# Patient Record
Sex: Female | Born: 2005 | Race: Black or African American | Hispanic: No | Marital: Single | Smoking: Never smoker
Health system: Southern US, Community
[De-identification: ages and names within clinical notes are randomized; demographics above are authoritative.]

## PROBLEM LIST (undated history)

## (undated) DIAGNOSIS — D649 Anemia, unspecified: Secondary | ICD-10-CM

## (undated) DIAGNOSIS — N390 Urinary tract infection, site not specified: Secondary | ICD-10-CM

## (undated) DIAGNOSIS — F419 Anxiety disorder, unspecified: Secondary | ICD-10-CM

## (undated) DIAGNOSIS — A749 Chlamydial infection, unspecified: Secondary | ICD-10-CM

## (undated) DIAGNOSIS — J45909 Unspecified asthma, uncomplicated: Secondary | ICD-10-CM

## (undated) DIAGNOSIS — F32A Depression, unspecified: Secondary | ICD-10-CM

## (undated) DIAGNOSIS — T7840XA Allergy, unspecified, initial encounter: Secondary | ICD-10-CM

## (undated) HISTORY — DX: Allergy, unspecified, initial encounter: T78.40XA

## (undated) HISTORY — PX: NO PAST SURGERIES: SHX2092

---
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
## 2024-01-03 ENCOUNTER — Other Ambulatory Visit: Payer: Self-pay | Admitting: Physician Assistant

## 2024-01-03 DIAGNOSIS — N200 Calculus of kidney: Secondary | ICD-10-CM

## 2024-01-04 ENCOUNTER — Ambulatory Visit
Admission: RE | Admit: 2024-01-04 | Discharge: 2024-01-04 | Disposition: A | Discharge status: Home / Self Care | Source: Ambulatory Visit | Attending: Physician Assistant | Admitting: Physician Assistant

## 2024-01-04 DIAGNOSIS — N200 Calculus of kidney: Secondary | ICD-10-CM

## 2024-01-20 ENCOUNTER — Other Ambulatory Visit: Payer: Self-pay

## 2024-01-20 ENCOUNTER — Encounter (HOSPITAL_BASED_OUTPATIENT_CLINIC_OR_DEPARTMENT_OTHER): Payer: Self-pay

## 2024-01-20 ENCOUNTER — Emergency Department (HOSPITAL_BASED_OUTPATIENT_CLINIC_OR_DEPARTMENT_OTHER)

## 2024-01-20 ENCOUNTER — Emergency Department (HOSPITAL_BASED_OUTPATIENT_CLINIC_OR_DEPARTMENT_OTHER)
Admission: EM | Admit: 2024-01-20 | Discharge: 2024-01-20 | Disposition: A | Discharge status: Home / Self Care | Attending: Emergency Medicine | Admitting: Emergency Medicine

## 2024-01-20 DIAGNOSIS — R109 Unspecified abdominal pain: Secondary | ICD-10-CM

## 2024-01-20 DIAGNOSIS — N3000 Acute cystitis without hematuria: Secondary | ICD-10-CM | POA: Diagnosis not present

## 2024-01-20 LAB — BASIC METABOLIC PANEL WITH GFR
Anion gap: 10 (ref 5–15)
BUN: 14 mg/dL (ref 6–20)
CO2: 23 mmol/L (ref 22–32)
Calcium: 9.6 mg/dL (ref 8.9–10.3)
Chloride: 103 mmol/L (ref 98–111)
Creatinine, Ser: 1.38 mg/dL — ABNORMAL HIGH (ref 0.44–1.00)
GFR, Estimated: 57 mL/min — ABNORMAL LOW (ref >60)
Glucose, Bld: 93 mg/dL (ref 70–99)
Potassium: 3.7 mmol/L (ref 3.5–5.1)
Sodium: 136 mmol/L (ref 135–145)

## 2024-01-20 LAB — URINALYSIS, ROUTINE W REFLEX MICROSCOPIC
Bacteria, UA: NONE SEEN
Bilirubin Urine: NEGATIVE
Glucose, UA: NEGATIVE mg/dL
Hgb urine dipstick: NEGATIVE
Ketones, ur: >80 mg/dL — AB
Nitrite: NEGATIVE
Protein, ur: 30 mg/dL — AB
Specific Gravity, Urine: 1.039 — ABNORMAL HIGH (ref 1.005–1.030)
pH: 5.5 (ref 5.0–8.0)

## 2024-01-20 LAB — PREGNANCY, URINE: Preg Test, Ur: NEGATIVE

## 2024-01-20 LAB — CBC WITH DIFFERENTIAL/PLATELET
Abs Immature Granulocytes: 0.03 10*3/uL (ref 0.00–0.07)
Basophils Absolute: 0.1 10*3/uL (ref 0.0–0.1)
Basophils Relative: 0 %
Eosinophils Absolute: 0.1 10*3/uL (ref 0.0–0.5)
Eosinophils Relative: 1 %
HCT: 39.6 % (ref 36.0–46.0)
Hemoglobin: 12.7 g/dL (ref 12.0–15.0)
Immature Granulocytes: 0 %
Lymphocytes Relative: 28 %
Lymphs Abs: 3.6 10*3/uL (ref 0.7–4.0)
MCH: 24.4 pg — ABNORMAL LOW (ref 26.0–34.0)
MCHC: 32.1 g/dL (ref 30.0–36.0)
MCV: 76.2 fL — ABNORMAL LOW (ref 80.0–100.0)
Monocytes Absolute: 1.3 10*3/uL — ABNORMAL HIGH (ref 0.1–1.0)
Monocytes Relative: 10 %
Neutro Abs: 7.8 10*3/uL — ABNORMAL HIGH (ref 1.7–7.7)
Neutrophils Relative %: 61 %
Platelets: 295 10*3/uL (ref 150–400)
RBC: 5.2 MIL/uL — ABNORMAL HIGH (ref 3.87–5.11)
RDW: 17.4 % — ABNORMAL HIGH (ref 11.5–15.5)
WBC: 12.9 10*3/uL — ABNORMAL HIGH (ref 4.0–10.5)
nRBC: 0 % (ref 0.0–0.2)

## 2024-01-20 MED ORDER — CEPHALEXIN 250 MG PO CAPS
500.0000 mg | ORAL_CAPSULE | Freq: Once | ORAL | Status: AC
  Administered 2024-01-20: 500 mg via ORAL
  Filled 2024-01-20: qty 2

## 2024-01-20 MED ORDER — ACETAMINOPHEN 500 MG PO TABS
1000.0000 mg | ORAL_TABLET | Freq: Once | ORAL | Status: AC
  Administered 2024-01-20: 1000 mg via ORAL
  Filled 2024-01-20: qty 2

## 2024-01-20 MED ORDER — CEPHALEXIN 500 MG PO CAPS
500.0000 mg | ORAL_CAPSULE | Freq: Two times a day (BID) | ORAL | 0 refills | Status: AC
Start: 2024-01-20 — End: 2024-01-25

## 2024-01-20 MED ORDER — ONDANSETRON 4 MG PO TBDP: 4.0000 mg | ORAL_TABLET | Freq: Three times a day (TID) | ORAL | 0 refills | Status: DC | PRN

## 2024-01-20 NOTE — ED Provider Notes (Signed)
 [facility] AT [facility] Provider Note   CSN: [id number] Arrival date & time: 01/20/24  2036     History  Chief Complaint  Patient presents with   Flank Pain    Marie Paul is a 18 y.o. female.  Patient here with pain in the right flank nausea.  History of anxiety depression UTI.  Does admit to marijuana use.  Denies any chest pain shortness of breath weakness numbness tingling.  Pain is much improved from earlier this evening.  She has had maybe kidney stones in the past but does not Solik she is ever had a CT scan that showed a kidney stone.  She denies any vaginal bleeding discharge.  She denies any concern for pregnancy.  She has had ileus in the past as well.  She denies any fever or chills.  Pain now resolved.  No specific pain with urination.  The history is provided by the patient.       Home Medications Prior to Admission medications   Medication Sig Start Date End Date Taking? Authorizing Provider  cephALEXin (KEFLEX) 500 MG capsule Take 1 capsule (500 mg total) by mouth 2 (two) times daily for 5 days. 01/20/24 01/25/24 Yes Dailyn Kempner, DO  ondansetron (ZOFRAN-ODT) 4 MG disintegrating tablet Take 1 tablet (4 mg total) by mouth every 8 (eight) hours as needed. 01/20/24  Yes Wynonna Fitzhenry, DO  albuterol (VENTOLIN HFA) 108 (90 Base) MCG/ACT inhaler Inhale 2 puffs into the lungs every 4 (four) hours as needed for wheezing or shortness of breath. 01/09/22   Trenton Gammon, MD  oxyCODONE-acetaminophen (PERCOCET/ROXICET) 5-325 MG tablet Take 1 tablet by mouth every 6 (six) hours as needed for severe pain (pain score 7-10). 11/26/23   Gwyneth Sprout, MD      Allergies    Other    Review of Systems   Review of Systems  Physical Exam Updated Vital Signs  ED Triage Vitals  Encounter Vitals Group     BP 01/20/24 2040 (!) 142/110     Systolic BP Percentile --      Diastolic BP Percentile --      Pulse Rate 01/20/24 2040 98     Resp  01/20/24 2040 18     Temp 01/20/24 2040 98.2 F (36.8 C)     Temp src --      SpO2 01/20/24 2040 93 %     Weight 01/20/24 2042 110 lb (49.9 kg)     Height 01/20/24 2042 5\' 3"  (1.6 m)     Head Circumference --      Peak Flow --      Pain Score 01/20/24 2042 8     Pain Loc --      Pain Education --      Exclude from Growth Chart --      Physical Exam Vitals and nursing note reviewed.  Constitutional:      General: She is not in acute distress.    Appearance: She is well-developed. She is not ill-appearing.  HENT:     Head: Normocephalic and atraumatic.     Nose: Nose normal.     Mouth/Throat:     Mouth: Mucous membranes are moist.  Eyes:     Extraocular Movements: Extraocular movements intact.     Conjunctiva/sclera: Conjunctivae normal.     Pupils: Pupils are equal, round, and reactive to light.  Cardiovascular:     Rate and Rhythm: Regular rhythm.     Pulses: Normal  pulses.     Heart sounds: Normal heart sounds. No murmur heard. Pulmonary:     Effort: Pulmonary effort is normal. No respiratory distress.     Breath sounds: Normal breath sounds.  Abdominal:     General: Abdomen is flat.     Palpations: Abdomen is soft.     Tenderness: There is no abdominal tenderness.  Musculoskeletal:        General: No swelling.     Cervical back: Normal range of motion and neck supple.  Skin:    General: Skin is warm and dry.     Capillary Refill: Capillary refill takes less than 2 seconds.  Neurological:     General: No focal deficit present.     Mental Status: She is alert and oriented to person, place, and time.     Cranial Nerves: No cranial nerve deficit.     Sensory: No sensory deficit.     Motor: No weakness.     Coordination: Coordination normal.  Psychiatric:        Mood and Affect: Mood normal.     ED Results / Procedures / Treatments   Labs (all labs ordered are listed, but only abnormal results are displayed) Labs Reviewed  URINALYSIS, ROUTINE W REFLEX  MICROSCOPIC - Abnormal; Notable for the following components:      Result Value   APPearance HAZY (*)    Specific Gravity, Urine 1.039 (*)    Ketones, ur >80 (*)    Protein, ur 30 (*)    Leukocytes,Ua LARGE (*)    All other components within normal limits  BASIC METABOLIC PANEL WITH GFR - Abnormal; Notable for the following components:   Creatinine, Ser 1.38 (*)    GFR, Estimated 57 (*)    All other components within normal limits  CBC WITH DIFFERENTIAL/PLATELET - Abnormal; Notable for the following components:   WBC 12.9 (*)    RBC 5.20 (*)    MCV 76.2 (*)    MCH 24.4 (*)    RDW 17.4 (*)    Neutro Abs 7.8 (*)    Monocytes Absolute 1.3 (*)    All other components within normal limits  PREGNANCY, URINE    EKG None  Radiology CT Renal Stone Study Result Date: 01/20/2024 CLINICAL DATA:  Flank pain EXAM: CT ABDOMEN AND PELVIS WITHOUT CONTRAST TECHNIQUE: Multidetector CT imaging of the abdomen and pelvis was performed following the standard protocol without IV contrast. RADIATION DOSE REDUCTION: This exam was performed according to the departmental dose-optimization program which includes automated exposure control, adjustment of the mA and/or kV according to patient size and/or use of iterative reconstruction technique. COMPARISON:  None Available. FINDINGS: Lower chest: Lung bases clear.  No pericardial or pleural effusion. Hepatobiliary: No focal liver abnormality is seen. No gallstones, gallbladder wall thickening, or biliary dilatation. Pancreas: Unremarkable. No pancreatic ductal dilatation or surrounding inflammatory changes. Spleen: Normal in size without focal abnormality. Adrenals/Urinary Tract: No adrenal lesions identified. No nephrolithiasis or hydronephrosis on the left. There is right-sided hydronephrosis without evidence of a stone. This could be due to either pyelonephritis or recent passage of an obstructing stone. Unremarkable urinary bladder. Stomach/Bowel: Stomach is  within normal limits. Appendix appears normal. No evidence of bowel wall thickening, distention, or inflammatory changes. Vascular/Lymphatic: No significant vascular findings are present. No enlarged abdominal or pelvic lymph nodes. Reproductive: Uterus and bilateral adnexa are unremarkable. Other: No abdominal wall hernia or abnormality. No abdominopelvic ascites. Musculoskeletal: No acute or significant osseous findings. IMPRESSION: 1.  Right-sided hydronephrosis without evidence of a stone. This could be due to either pyelonephritis or recent passage of an obstructing stone. 2. No other significant abnormalities. Electronically Signed   By: Layla Maw M.D.   On: 01/20/2024 21:43    Procedures Procedures    Medications Ordered in ED Medications  cephALEXin (KEFLEX) capsule 500 mg (500 mg Oral Given 01/20/24 2252)  acetaminophen (TYLENOL) tablet 1,000 mg (1,000 mg Oral Given 01/20/24 2252)    ED Course/ Medical Decision Making/ A&P                                 Medical Decision Making Amount and/or Complexity of Data Reviewed Labs: ordered. Radiology: ordered.  Risk OTC drugs. Prescription drug management.   Marie Paul is here with right flank pain.  Normal vitals.  No fever.  History of UTI asthma.  History of marijuana use.  She had pretty severe right flank pain earlier this evening.  Now resolved upon my evaluation.  But she is already had a CBC CMP lipase urinalysis CT scan done prior to my evaluation.  There is concern that she might have a history of kidney stones as she has had some recurrent pain like this in the past.  But CT scans have never showed a stone.  She has had ileus in the past.  Differential diagnosis includes UTI kidney stone ileus/bowel obstruction, seems less likely to be appendicitis.  Exam currently is unremarkable.  CT scan per radiology report does show some right-sided hydronephrosis but without evidence of a stone.  They suggest may be  pyelonephritis or recent passage of a obstructive kidney stone.  Otherwise CT is unremarkable.  There is no ovarian cyst.  There is no appendicitis or changes to the bowel wall, no bowel obstruction or ileus.  Ultimately I do suspect that she has a recent passed kidney stone given the history and hydronephrosis.  She has got a mild elevation of her creatinine to 1.3 but otherwise lab works unremarkable.  Mild white count of 12.  I will put her on antibiotics to treat for possibly a pyelonephritis/kidney infection/UTI.  Will Keflex.  Will prescribe Zofran as needed.  Ultimately I do think that this is a passed kidney stone.  Pains now resolved.  Will have her follow-up with urology.  Will have her follow-up with GI as well.  I do think there could be an element of marijuana hyperemesis.  She has had an ileus in the past.  Does not seem like there is inflammatory bowel process but they could be reasonable for her to maybe discuss gotten a colonoscopy if she continues to have some chronic pain.  Overall discharged in good condition.  Understands return precautions.  This chart was dictated using voice recognition software.  Despite best efforts to proofread,  errors can occur which can change the documentation meaning.         Final Clinical Impression(s) / ED Diagnoses Final diagnoses:  Acute cystitis without hematuria  Abdominal pain, unspecified abdominal location    Rx / DC Orders ED Discharge Orders          Ordered    ondansetron (ZOFRAN-ODT) 4 MG disintegrating tablet  Every 8 hours PRN        01/20/24 2238    cephALEXin (KEFLEX) 500 MG capsule  2 times daily        01/20/24 2238  Virgina Norfolk, DO 01/20/24 2308

## 2024-01-20 NOTE — Discharge Instructions (Addendum)
 Recommend cessation of marijuana.  Take antibiotic as prescribed.  Take Zofran as needed for nausea.  Follow-up with urology and GI.  Return if symptoms worsen.  Recommend 1000 mg of Tylenol every 6 hours as needed for pain recommend 400 mg ibuprofen every 8 hours as needed for pain.

## 2024-01-20 NOTE — ED Triage Notes (Signed)
 Intermittent right flank pain for several weeks. Pain intensified today with vomiting. Seen at UC earlier today and encouraged to ED.   Given Toradol/ Zofran pta.   Says she's been seen recently for [place] and CT scans.

## 2024-01-25 ENCOUNTER — Other Ambulatory Visit

## 2024-07-04 ENCOUNTER — Encounter: Payer: Self-pay | Admitting: Obstetrics and Gynecology

## 2024-07-04 ENCOUNTER — Ambulatory Visit: Admitting: Obstetrics and Gynecology

## 2024-07-04 VITALS — BP 94/60 | HR 94 | Ht 64.0 in | Wt 113.0 lb

## 2024-07-04 DIAGNOSIS — N760 Acute vaginitis: Secondary | ICD-10-CM

## 2024-07-04 DIAGNOSIS — Z3009 Encounter for other general counseling and advice on contraception: Secondary | ICD-10-CM

## 2024-07-04 DIAGNOSIS — F419 Anxiety disorder, unspecified: Secondary | ICD-10-CM | POA: Diagnosis not present

## 2024-07-04 DIAGNOSIS — R399 Unspecified symptoms and signs involving the genitourinary system: Secondary | ICD-10-CM

## 2024-07-04 DIAGNOSIS — Z30013 Encounter for initial prescription of injectable contraceptive: Secondary | ICD-10-CM

## 2024-07-04 DIAGNOSIS — B9689 Other specified bacterial agents as the cause of diseases classified elsewhere: Secondary | ICD-10-CM

## 2024-07-04 DIAGNOSIS — Z3202 Encounter for pregnancy test, result negative: Secondary | ICD-10-CM | POA: Diagnosis not present

## 2024-07-04 DIAGNOSIS — Z1331 Encounter for screening for depression: Secondary | ICD-10-CM

## 2024-07-04 LAB — POCT URINALYSIS DIPSTICK
Bilirubin, UA: NEGATIVE
Blood, UA: NEGATIVE
Glucose, UA: NEGATIVE
Ketones, UA: NEGATIVE
Nitrite, UA: NEGATIVE
Protein, UA: NEGATIVE
Spec Grav, UA: 1.025 (ref 1.010–1.025)
Urobilinogen, UA: 0.2 U/dL
pH, UA: 6 (ref 5.0–8.0)

## 2024-07-04 LAB — POCT URINE PREGNANCY: Preg Test, Ur: NEGATIVE

## 2024-07-04 MED ORDER — MEDROXYPROGESTERONE ACETATE 150 MG/ML IM SUSP: 150.0000 mg | INTRAMUSCULAR | 3 refills | Status: AC

## 2024-07-04 MED ORDER — METRONIDAZOLE 0.75 % VA GEL: 1.0000 | Freq: Every day | VAGINAL | 1 refills | Status: DC

## 2024-07-04 MED ORDER — MEDROXYPROGESTERONE ACETATE 150 MG/ML IM SUSP
150.0000 mg | Freq: Once | INTRAMUSCULAR | Status: AC
  Administered 2024-07-04: 150 mg via INTRAMUSCULAR

## 2024-07-04 NOTE — Progress Notes (Signed)
 NEW GYNECOLOGY VISIT Chief Complaint  Patient presents with   NEW PATIENT/GYN     Subjective:  Marie Paul is a 18 y.o. G1P0010 who presents for contraceptive consult.  She reports she is interested in starting depo. Also reports foul smelling vaginal discharge for a couple months. Has had BV before. Per chart review, she had a vaginal swab 8/21 that was intermediate for BV. STI testing negative at that time. She also reports urinary frequency and urgency. Hx kidney stones/infections.   Gyn History: Patient's last menstrual period was 06/04/2024 (exact date). Sexually active: yes/no: Yes Contraception: condoms History of STIs: Yes: hx chlamydia, treated Last pap: N/A History of abnormal pap: N/A Periods: regular     07/04/2024    1:15 PM 07/20/2022    5:33 PM  Depression screen PHQ 2/9  Decreased Interest 0 3  Down, Depressed, Hopeless 1 3  PHQ - 2 Score 1 6  Altered sleeping 1 3  Tired, decreased energy 1 3  Change in appetite 0 3  Feeling bad or failure about yourself  0 2  Trouble concentrating 0 3  Moving slowly or fidgety/restless 0 3  Suicidal thoughts 0   PHQ-9 Score 3 23  Difficult doing work/chores Not difficult at all        07/04/2024    1:15 PM 07/20/2022    5:33 PM  GAD 7 : Generalized Anxiety Score  Nervous, Anxious, on Edge 2 3  Control/stop worrying 2 3  Worry too much - different things 2 3  Trouble relaxing 2 3  Restless 2 2  Easily annoyed or irritable 2 3  Afraid - awful might happen 2 3  Total GAD 7 Score 14 20  Anxiety Difficulty Somewhat difficult       OB History     Gravida  1   Para      Term      Preterm      AB  1   Living  0      SAB      IAB      Ectopic  1   Multiple      Live Births              Past Medical History:  Diagnosis Date   Adjustment disorder with depressed mood 11/22/2015   Adopted 11/13/2013   Adopted    Allergy    [employer] 03/12/2020   Anemia    Anxiety    Asthma     Chlamydia    Depression    Growing pains 12/02/2015   History of motion sickness 12/02/2015   UTI (urinary tract infection)     Past Surgical History:  Procedure Laterality Date   NO PAST SURGERIES      Social History   Socioeconomic History   Marital status: Single    Spouse name: Not on file   Number of children: Not on file   Years of education: Not on file   Highest education level: Not on file  Occupational History   Not on file  Tobacco Use   Smoking status: Never    Passive exposure: Current   Smokeless tobacco: Current   Tobacco comments:        Stopped with +preg  Vaping Use   Vaping status: Every Day  Substance and Sexual Activity   Alcohol use: Not Currently   Drug use: Yes    Frequency: 7.0 times per week    Types: Marijuana  Comment: approximately one blunt per day, none since + preg   Sexual activity: Yes    Birth control/protection: Condom  Other Topics Concern   Not on file  Social History Narrative   Hadassah is being adopted by her foster parents 01/2015   11th grade at [employer] 22-23 school year.   Braelynne splits time living with adoptive mother and adoptive father. At father's house it is Leeasia and father in a two bedroom apartment. Pets in home include 1 cat. No smoke exposures at father's house. At mother's house it is Marielis, mother, and several other people in a house. Pets in home include 2 dogs. No smoke exposures at [employer].    Social Drivers of Corporate investment banker Strain: Not on File (08/25/2023)   Received from [employer]    Financial Resource Strain: 0  Food Insecurity: Not at Risk (10/07/2023)   Received from [facility] Insecurity    Within the past 12 months, you worried that your food would run out before you got money to buy more.: 1  Transportation Needs: Not at Risk (10/07/2023)   Received from [employer] Needs    In the past 12 months, has lack of  transportation kept you from medical appointments, meetings, work or from getting things needed for daily living?: 1  Physical Activity: Not on File (08/25/2023)   Received from [facility]   Physical Activity    Physical Activity: 0  Stress: Not on File (08/25/2023)   Received from [facility]   Stress    Stress: 0  Social Connections: Not on File (08/25/2023)   Received from [facility]   Social Connections    Connectedness: 0    Family History  Problem Relation Age of Onset   Crohn's disease Mother    Healthy Father    Asthma Sister    Eczema Sister     Current Outpatient Medications on File Prior to Visit  Medication Sig Dispense Refill   albuterol  (VENTOLIN  HFA) 108 (90 Base) MCG/ACT inhaler Inhale 2 puffs into the lungs every 4 (four) hours as needed for wheezing or shortness of breath. 18 g 0   ondansetron  (ZOFRAN -ODT) 4 MG disintegrating tablet Take 1 tablet (4 mg total) by mouth every 8 (eight) hours as needed. 20 tablet 0   oxyCODONE -acetaminophen  (PERCOCET/ROXICET) 5-325 MG tablet Take 1 tablet by mouth every 6 (six) hours as needed for severe pain (pain score 7-10). (Patient not taking: Reported on 07/04/2024) 15 tablet 0   No current facility-administered medications on file prior to visit.    Allergies  Allergen Reactions   Other     Cats - itching, watery eyes, nose     Objective:   Vitals:   07/04/24 1307  BP: 94/60  Pulse: 94  Weight: 113 lb (51.3 kg)  Height: 5' 4 (1.626 m)   Physical Examination:   General appearance - well appearing, and in no distress  Mental status - alert, oriented to person, place, and time  Psych:  normal mood and affect  Skin - warm and dry, normal color, no suspicious lesions noted    Assessment and Plan:  1. Contraceptive education (Primary) Counseled on options for contraception in detail. Patient interested in depo. Reviewed common side effects including weight gain, mood changes, irregular bleeding. Also reviewed recently depo has  been in the news for concern of an elevated risk of brain tumor (meningioma). Revjiewed that the overall  risk is very low (increased risk from 1 in 10,000 to 5 in 10,000 according to one study with several limitations). She is comfortable proceeding with depo today. She denies any unprotected intercourse since her last period. - UPT neg - start depo  2. Encounter for initial prescription of injectable contraceptive - POCT urine pregnancy  3. Bacterial vaginosis Exam deferred given symptoms and swab at PCP. Rx given - metroNIDAZOLE  (METROGEL ) 0.75 % vaginal gel; Place 1 Applicatorful vaginally at bedtime. Apply one applicatorful to vagina at bedtime for 5 days  Dispense: 70 g; Refill: 1  4. Anxiety Patient reports stable mood, has a therapist. Declines further intervention  5. Urinary symptom or sign Will f/u ucx - POCT Urinalysis Dipstick - Urine Culture   Rollo ONEIDA Bring, MD, FACOG Obstetrician & Gynecologist, [facility] for [facility], [facility]

## 2024-07-04 NOTE — Progress Notes (Addendum)
 18 y.o. New GYN presents for [employer]. Pt wants to start DEPO. She currently uses Condoms for [facility], last sexual intercourse 07/02/24. C/o thick, copious, malodorous vaginal discharge x 2 months, kidney pain, urinary frequency.   UPT Negative  DEPO Injection given in RUOVG, tolerated well. RX sent to [facility] with 3 refills.  Next DEPO due Dec. 2 - 16, 2025  Administrations This Visit     medroxyPROGESTERone  (DEPO-PROVERA ) injection 150 mg     Admin Date 07/04/2024 Action Given Dose 150 mg Route Intramuscular Documented By Burnard Niels PARAS, RMA

## 2024-07-07 ENCOUNTER — Ambulatory Visit: Payer: Self-pay | Admitting: Obstetrics and Gynecology

## 2024-07-07 DIAGNOSIS — N3000 Acute cystitis without hematuria: Secondary | ICD-10-CM

## 2024-07-07 LAB — URINE CULTURE

## 2024-07-07 MED ORDER — SULFAMETHOXAZOLE-TRIMETHOPRIM 800-160 MG PO TABS: 1.0000 | ORAL_TABLET | Freq: Two times a day (BID) | ORAL | 0 refills | Status: AC

## 2024-07-08 DIAGNOSIS — N12 Tubulo-interstitial nephritis, not specified as acute or chronic: Secondary | ICD-10-CM | POA: Diagnosis not present

## 2024-07-08 DIAGNOSIS — R109 Unspecified abdominal pain: Secondary | ICD-10-CM | POA: Diagnosis present

## 2024-07-09 ENCOUNTER — Other Ambulatory Visit: Payer: Self-pay

## 2024-07-09 ENCOUNTER — Emergency Department (HOSPITAL_BASED_OUTPATIENT_CLINIC_OR_DEPARTMENT_OTHER)
Admission: EM | Admit: 2024-07-09 | Discharge: 2024-07-09 | Disposition: A | Discharge status: Home / Self Care | Attending: Emergency Medicine | Admitting: Emergency Medicine

## 2024-07-09 ENCOUNTER — Emergency Department (HOSPITAL_BASED_OUTPATIENT_CLINIC_OR_DEPARTMENT_OTHER)

## 2024-07-09 ENCOUNTER — Encounter (HOSPITAL_BASED_OUTPATIENT_CLINIC_OR_DEPARTMENT_OTHER): Payer: Self-pay

## 2024-07-09 DIAGNOSIS — N12 Tubulo-interstitial nephritis, not specified as acute or chronic: Secondary | ICD-10-CM

## 2024-07-09 LAB — URINALYSIS, W/ REFLEX TO CULTURE (INFECTION SUSPECTED)
Bilirubin Urine: NEGATIVE
Glucose, UA: NEGATIVE mg/dL
Nitrite: NEGATIVE
Protein, ur: 30 mg/dL — AB
Specific Gravity, Urine: 1.027 (ref 1.005–1.030)
WBC, UA: >50 WBC/hpf (ref 0–5)
pH: 8 (ref 5.0–8.0)

## 2024-07-09 LAB — BASIC METABOLIC PANEL WITH GFR
Anion gap: 13 (ref 5–15)
BUN: 14 mg/dL (ref 6–20)
CO2: 21 mmol/L — ABNORMAL LOW (ref 22–32)
Calcium: 9.3 mg/dL (ref 8.9–10.3)
Chloride: 105 mmol/L (ref 98–111)
Creatinine, Ser: 0.99 mg/dL (ref 0.44–1.00)
GFR, Estimated: >60 mL/min (ref >60)
Glucose, Bld: 106 mg/dL — ABNORMAL HIGH (ref 70–99)
Potassium: 3.6 mmol/L (ref 3.5–5.1)
Sodium: 139 mmol/L (ref 135–145)

## 2024-07-09 LAB — CBC
HCT: 35.9 % — ABNORMAL LOW (ref 36.0–46.0)
Hemoglobin: 11.9 g/dL — ABNORMAL LOW (ref 12.0–15.0)
MCH: 26.7 pg (ref 26.0–34.0)
MCHC: 33.1 g/dL (ref 30.0–36.0)
MCV: 80.7 fL (ref 80.0–100.0)
Platelets: 222 K/uL (ref 150–400)
RBC: 4.45 MIL/uL (ref 3.87–5.11)
RDW: 15 % (ref 11.5–15.5)
WBC: 17.2 K/uL — ABNORMAL HIGH (ref 4.0–10.5)
nRBC: 0 % (ref 0.0–0.2)

## 2024-07-09 LAB — PREGNANCY, URINE: Preg Test, Ur: NEGATIVE

## 2024-07-09 MED ORDER — SODIUM CHLORIDE 0.9 % IV SOLN
1.0000 g | Freq: Once | INTRAVENOUS | Status: AC
  Administered 2024-07-09: 1 g via INTRAVENOUS
  Filled 2024-07-09: qty 10

## 2024-07-09 MED ORDER — KETOROLAC TROMETHAMINE 10 MG PO TABS: 10.0000 mg | ORAL_TABLET | Freq: Four times a day (QID) | ORAL | 0 refills | Status: DC | PRN

## 2024-07-09 MED ORDER — CEFPODOXIME PROXETIL 200 MG PO TABS: 200.0000 mg | ORAL_TABLET | Freq: Two times a day (BID) | ORAL | 0 refills | Status: AC

## 2024-07-09 MED ORDER — FENTANYL CITRATE PF 50 MCG/ML IJ SOSY
50.0000 ug | PREFILLED_SYRINGE | Freq: Once | INTRAMUSCULAR | Status: AC
  Administered 2024-07-09: 50 ug via INTRAVENOUS
  Filled 2024-07-09: qty 1

## 2024-07-09 MED ORDER — FENTANYL CITRATE PF 50 MCG/ML IJ SOSY
50.0000 ug | PREFILLED_SYRINGE | INTRAMUSCULAR | Status: DC | PRN
  Administered 2024-07-09: 50 ug via INTRAVENOUS
  Filled 2024-07-09: qty 1

## 2024-07-09 MED ORDER — ONDANSETRON HCL 4 MG/2ML IJ SOLN
4.0000 mg | Freq: Once | INTRAMUSCULAR | Status: AC
  Administered 2024-07-09: 4 mg via INTRAVENOUS
  Filled 2024-07-09: qty 2

## 2024-07-09 MED ORDER — SODIUM CHLORIDE 0.9 % IV BOLUS
1000.0000 mL | Freq: Once | INTRAVENOUS | Status: AC
  Administered 2024-07-09: 1000 mL via INTRAVENOUS

## 2024-07-09 MED ORDER — SODIUM CHLORIDE 0.9 % IV SOLN: INTRAVENOUS | Status: DC

## 2024-07-09 NOTE — ED Triage Notes (Signed)
 Sudden right sided flank pain that manifested about 1 hour prior to arrival.   Recent + urine culture with prescription called in but she was out of town and unable to obtain.

## 2024-07-09 NOTE — ED Provider Notes (Signed)
 [place] [facility] DEPARTMENT AT [facility] Provider Note   CSN: [id number] Arrival date & time: 07/08/24  2358     Patient presents with: Flank Pain   Marie Paul is a 18 y.o. female.    Flank Pain     18 year old female presenting to the emerged by with sudden onset right-sided flank pain that started roughly 1 hour prior to arrival.  She had 1 episode of NBNB emesis as well.  She denies any abdominal pain.  She did have a recent urine culture and had been called in a prescription based on a urine culture that was positive for Staphylococcus saprophyticus but has not taken any antibiotics.  She was last seen in the emergency department in April 2025 during which time CT imaging showed right-sided hydronephrosis without evidence of a stone.  She has had intermittent flank pain over the past year.  Prior to Admission medications   Medication Sig Start Date End Date Taking? Authorizing Provider  cefpodoxime  (VANTIN ) 200 MG tablet Take 1 tablet (200 mg total) by mouth 2 (two) times daily for 10 days. 07/09/24 07/19/24 Yes Jerrol Agent, MD  ketorolac  (TORADOL ) 10 MG tablet Take 1 tablet (10 mg total) by mouth every 6 (six) hours as needed. 07/09/24  Yes Jerrol Agent, MD  albuterol  (VENTOLIN  HFA) 108 (90 Base) MCG/ACT inhaler Inhale 2 puffs into the lungs every 4 (four) hours as needed for wheezing or shortness of breath. 01/09/22   Curtiss Antonio CROME, MD  medroxyPROGESTERone  (DEPO-PROVERA ) 150 MG/ML injection Inject 1 mL (150 mg total) into the muscle every 3 (three) months. 07/04/24   Abigail Rollo DASEN, MD  metroNIDAZOLE  (METROGEL ) 0.75 % vaginal gel Place 1 Applicatorful vaginally at bedtime. Apply one applicatorful to vagina at bedtime for 5 days 07/04/24   Abigail Rollo DASEN, MD  ondansetron  (ZOFRAN -ODT) 4 MG disintegrating tablet Take 1 tablet (4 mg total) by mouth every 8 (eight) hours as needed. 01/20/24   Curatolo, Adam, DO  oxyCODONE -acetaminophen  (PERCOCET/ROXICET)  5-325 MG tablet Take 1 tablet by mouth every 6 (six) hours as needed for severe pain (pain score 7-10). Patient not taking: Reported on 07/04/2024 11/26/23   Doretha Folks, MD  sulfamethoxazole -trimethoprim  (BACTRIM  DS) 800-160 MG tablet Take 1 tablet by mouth 2 (two) times daily for 7 days. 07/07/24 07/14/24  Abigail Rollo DASEN, MD    Allergies: Other    Review of Systems  Gastrointestinal:  Positive for nausea and vomiting.  Genitourinary:  Positive for flank pain.  All other systems reviewed and are negative.   Updated Vital Signs BP 112/67   Pulse 81   Temp 97.6 F (36.4 C)   Resp 18   Ht 5' 4 (1.626 m)   Wt 51.3 kg   LMP 06/04/2024 (Exact Date)   SpO2 100%   BMI 19.40 kg/m   Physical Exam Vitals and nursing note reviewed.  Constitutional:      General: She is in acute distress.     Appearance: She is well-developed.     Comments: Tearful  HENT:     Head: Normocephalic and atraumatic.  Eyes:     Conjunctiva/sclera: Conjunctivae normal.  Cardiovascular:     Rate and Rhythm: Normal rate and regular rhythm.     Heart sounds: No murmur heard. Pulmonary:     Effort: Pulmonary effort is normal. No respiratory distress.     Breath sounds: Normal breath sounds.  Abdominal:     Palpations: Abdomen is soft.     Tenderness: There  is no abdominal tenderness. There is right CVA tenderness.  Musculoskeletal:        General: No swelling.     Cervical back: Neck supple.  Skin:    General: Skin is warm and dry.     Capillary Refill: Capillary refill takes less than 2 seconds.  Neurological:     Mental Status: She is alert.  Psychiatric:        Mood and Affect: Mood normal.     (all labs ordered are listed, but only abnormal results are displayed) Labs Reviewed  URINALYSIS, W/ REFLEX TO CULTURE (INFECTION SUSPECTED) - Abnormal; Notable for the following components:      Result Value   APPearance HAZY (*)    Hgb urine dipstick SMALL (*)    Ketones, ur TRACE (*)     Protein, ur 30 (*)    Leukocytes,Ua LARGE (*)    Bacteria, UA RARE (*)    All other components within normal limits  BASIC METABOLIC PANEL WITH GFR - Abnormal; Notable for the following components:   CO2 21 (*)    Glucose, Bld 106 (*)    All other components within normal limits  CBC - Abnormal; Notable for the following components:   WBC 17.2 (*)    Hemoglobin 11.9 (*)    HCT 35.9 (*)    All other components within normal limits  URINE CULTURE  PREGNANCY, URINE    EKG: None  Radiology: CT RENAL STONE STUDY Result Date: 07/09/2024 EXAM: CT ABDOMEN AND PELVIS WITHOUT CONTRAST 07/09/2024 01:15:58 AM TECHNIQUE: CT of the abdomen and pelvis was performed without the administration of intravenous contrast. Multiplanar reformatted images are provided for review. Automated exposure control, iterative reconstruction, and/or weight-based adjustment of the mA/kV was utilized to reduce the radiation dose to as low as reasonably achievable. COMPARISON: 01/20/2024 CLINICAL HISTORY: Abdominal/flank pain, stone suspected. Sudden right sided flank pain that manifested about 1 hour prior to arrival. Recent + urine culture with prescription called in but she was out of town and unable to obtain. FINDINGS: LOWER CHEST: No acute abnormality. LIVER: The liver is unremarkable. GALLBLADDER AND BILE DUCTS: Gallbladder is unremarkable. No biliary ductal dilatation. SPLEEN: No acute abnormality. PANCREAS: No acute abnormality. ADRENAL GLANDS: No acute abnormality. KIDNEYS, URETERS AND BLADDER: Chronic moderate right hydronephrosis with extrarenal pelvis, suggesting UPJ stenosis (coronal image 42). No renal, ureteral, or bladder calculi. No hydronephrosis. No perinephric or periureteral stranding. Urinary bladder is unremarkable. GI AND BOWEL: Stomach demonstrates no acute abnormality. There is no bowel obstruction. Normal appendix (image 55). PERITONEUM AND RETROPERITONEUM: No ascites. No free air. VASCULATURE: Aorta is  normal in caliber. LYMPH NODES: No lymphadenopathy. REPRODUCTIVE ORGANS: 4.4 cm right ovarian cyst/follicle (image 54), likely benign/physiologic. Uterus and left ovary are within normal limits. BONES AND SOFT TISSUES: No acute osseous abnormality. No focal soft tissue abnormality. IMPRESSION: 1. No acute findings. 2. 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 3. 4.4 cm right ovarian cyst/follicle, likely benign/physiologic. Electronically signed by: Pinkie Pebbles MD 07/09/2024 01:21 AM EDT RP Workstation: [id number]     Procedures   Medications Ordered in the ED  sodium chloride  0.9 % bolus 1,000 mL (1,000 mLs Intravenous New Bag/Given 07/09/24 0040)    And  0.9 %  sodium chloride  infusion ( Intravenous New Bag/Given 07/09/24 0044)  cefTRIAXone  (ROCEPHIN ) 1 g in sodium chloride  0.9 % 100 mL IVPB (1 g Intravenous New Bag/Given 07/09/24 0143)  fentaNYL  (SUBLIMAZE ) injection 50 mcg (50 mcg Intravenous Given 07/09/24 0144)  ondansetron  (ZOFRAN ) injection 4  mg (4 mg Intravenous Given 07/09/24 0041)  fentaNYL  (SUBLIMAZE ) injection 50 mcg (50 mcg Intravenous Given 07/09/24 0041)    Clinical Course as of 07/09/24 0156  Sun Jul 09, 2024  0122 Leukocytes,Ua(!): LARGE [JL]  0122 RBC / HPF: 21-50 [JL]  0122 WBC, UA: >50 [JL]  0122 Bacteria, UA(!): RARE [JL]  0123 WBC(!): 17.2 [JL]    Clinical Course User Index [JL] Jerrol Agent, MD                                 Medical Decision Making Amount and/or Complexity of Data Reviewed Labs: ordered. Decision-making details documented in ED Course. Radiology: ordered.  Risk Prescription drug management.    18 year old female presenting to the emerged by with sudden onset right-sided flank pain that started roughly 1 hour prior to arrival.  She had 1 episode of NBNB emesis as well.  She denies any abdominal pain.  She did have a recent urine culture and had been called in a prescription based on a urine culture that was positive for Staphylococcus  saprophyticus but has not taken any antibiotics.  She was last seen in the emergency department in April 2025 during which time CT imaging showed right-sided hydronephrosis without evidence of a stone.  She has had intermittent flank pain over the past year.  On arrival, the patient was afebrile, not tachycardic or tachypneic, hemodynamically stable, saturating well on room air.  Patient with right-sided CVA tenderness in the setting of sudden onset right-sided flank pain.  Considered nephrolithiasis, considered UTI/pyelonephritis.  Less likely other acute intra-abdominal abnormality.    IV access obtained the patient was administered IV fentanyl , IV Zofran , IV fluid bolus for volume resuscitation, nausea control and pain control.  Labs: Urinalysis positive for UTI with large leukocytes, 20-50 RBCs and greater than 50 WBCs, rare bacteria present, CBC with leukocytosis 17.2, hemoglobin 11.9, BMP generally unremarkable with creatinine 0.99.  Bicarbonate 21, anion gap 13. Urine preg neg.  CT stone study:  IMPRESSION:  1. No acute findings.  2. 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  3. 4.4 cm right ovarian cyst/follicle, likely benign/physiologic.    Urology: Dr. Cam of on-call urology who recommended treatment for pyelonephritis and outpatient follow-up in clinic.     Final diagnoses:  Pyelonephritis    ED Discharge Orders          Ordered    Ambulatory referral to Urology        07/09/24 0154    cefpodoxime  (VANTIN ) 200 MG tablet  2 times daily        07/09/24 0155    ketorolac  (TORADOL ) 10 MG tablet  Every 6 hours PRN        07/09/24 0155               Jerrol Agent, MD 07/09/24 [id number]

## 2024-07-09 NOTE — Discharge Instructions (Addendum)
 You were found to have pyelonephritis with no evidence of stone seen on CT imaging.  Please follow-up with urology outpatient regarding your ER presentation today.  Take antibiotics for UTI, Toradol  has been prescribed for pain control.

## 2024-07-11 LAB — URINE CULTURE: Culture: 80000 — AB

## 2024-07-12 ENCOUNTER — Telehealth (HOSPITAL_BASED_OUTPATIENT_CLINIC_OR_DEPARTMENT_OTHER): Payer: Self-pay

## 2024-07-12 NOTE — Telephone Encounter (Signed)
 Post ED Visit - Positive Culture Follow-up  Culture report reviewed by antimicrobial stewardship pharmacist: [facility] []  Rankin Dee, Pharm.D. []  Venetia Gully, Pharm.D., BCPS AQ-ID []  Garrel Crews, Pharm.D., BCPS []  Almarie Lunger, Pharm.D., BCPS []  [place], [address].D., BCPS, AAHIVP []  Rosaline Bihari, Pharm.D., BCPS, AAHIVP []  Vernell Meier, PharmD, BCPS []  Latanya Hint, PharmD, BCPS []  Donald Medley, PharmD, BCPS []  Rocky Bold, PharmD []  Dorothyann Alert, PharmD, BCPS []  Morene Babe, PharmD  [facility] []  Rosaline Edison, PharmD []  Romona Bliss, PharmD []  Dolphus Roller, PharmD []  Veva Seip, Rph []  Vernell Daunt) Leonce, PharmD []  Eva Allis, PharmD []  Rosaline Millet, PharmD []  Iantha Batch, PharmD []  Arvin Gauss, PharmD []  Wanda Hasting, PharmD []  Ronal Rav, PharmD []  Rocky Slade, PharmD []  Bard Jeans, PharmD   Positive urine culture Reviewed by ED provider Lonni Sakai, MD Pt c/o of right side flank pain, urology contacted to treat pyleo - MD aware of culture.  no further patient follow-up is required at this time.  Marie Paul 07/12/2024, 11:35 AM

## 2024-07-19 ENCOUNTER — Encounter (INDEPENDENT_AMBULATORY_CARE_PROVIDER_SITE_OTHER): Payer: Self-pay

## 2024-07-20 ENCOUNTER — Encounter (INDEPENDENT_AMBULATORY_CARE_PROVIDER_SITE_OTHER): Payer: Self-pay

## 2024-09-19 ENCOUNTER — Ambulatory Visit: Payer: Self-pay

## 2024-09-19 MED ORDER — MEDROXYPROGESTERONE ACETATE 150 MG/ML IM SUSP: 150.0000 mg | INTRAMUSCULAR | Status: AC

## 2024-09-19 NOTE — Progress Notes (Unsigned)
 Pt came for depo, already picked up rx from pharmacy but forgot at home Depo window from Dec 2-16 Rescheduled appt for 12/8

## 2024-09-21 ENCOUNTER — Ambulatory Visit: Payer: Self-pay | Admitting: Nurse Practitioner

## 2024-09-25 ENCOUNTER — Ambulatory Visit: Payer: Self-pay

## 2024-10-16 ENCOUNTER — Emergency Department (HOSPITAL_BASED_OUTPATIENT_CLINIC_OR_DEPARTMENT_OTHER)
Admission: EM | Admit: 2024-10-16 | Discharge: 2024-10-16 | Disposition: A | Discharge status: Home / Self Care | Payer: Self-pay | Attending: Emergency Medicine | Admitting: Emergency Medicine

## 2024-10-16 ENCOUNTER — Other Ambulatory Visit: Payer: Self-pay

## 2024-10-16 ENCOUNTER — Encounter (HOSPITAL_BASED_OUTPATIENT_CLINIC_OR_DEPARTMENT_OTHER): Payer: Self-pay | Admitting: Emergency Medicine

## 2024-10-16 DIAGNOSIS — J4541 Moderate persistent asthma with (acute) exacerbation: Secondary | ICD-10-CM | POA: Insufficient documentation

## 2024-10-16 MED ORDER — ALBUTEROL SULFATE HFA 108 (90 BASE) MCG/ACT IN AERS
1.0000 | INHALATION_SPRAY | Freq: Once | RESPIRATORY_TRACT | Status: AC
  Administered 2024-10-16: 2 via RESPIRATORY_TRACT

## 2024-10-16 MED ORDER — IPRATROPIUM-ALBUTEROL 0.5-2.5 (3) MG/3ML IN SOLN
3.0000 mL | Freq: Once | RESPIRATORY_TRACT | Status: AC
  Administered 2024-10-16: 3 mL via RESPIRATORY_TRACT
  Filled 2024-10-16: qty 3

## 2024-10-16 MED ORDER — ALBUTEROL SULFATE HFA 108 (90 BASE) MCG/ACT IN AERS
INHALATION_SPRAY | RESPIRATORY_TRACT | Status: AC
  Filled 2024-10-16: qty 6.7

## 2024-10-16 MED ORDER — PREDNISONE 10 MG PO TABS: 20.0000 mg | ORAL_TABLET | Freq: Every day | ORAL | 0 refills | Status: AC

## 2024-10-16 MED ORDER — PREDNISONE 50 MG PO TABS
60.0000 mg | ORAL_TABLET | Freq: Once | ORAL | Status: AC
  Administered 2024-10-16: 60 mg via ORAL
  Filled 2024-10-16: qty 1

## 2024-10-16 NOTE — ED Provider Notes (Signed)
 " [place] EMERGENCY DEPARTMENT AT [facility] Provider Note   CSN: [id number] Arrival date & time: 10/16/24  0757     History Chief Complaint  Patient presents with   Shortness of Breath    HPI: Marie Paul is a 18 y.o. female with history pertinent for asthma who presents complaining of wheezing. Patient arrived via POV accompanied by boyfriend, Marie Paul.  History provided by patient.  No interpreter required during this encounter.  Patient reports that over the past several days she has had wheezing and cough that has been increased.  Reports that it feels like her typical asthma exacerbations which are generally characterized by wheezing and cough.  Reports that she has been using her albuterol  inhaler multiple times per day, reports that her only home inhaler is albuterol .  Reports that she last used it last night when she ran out of it, however this morning she felt like she had recurrent significant wheezing, therefore wanted to come into the emergency department.  Denies any fevers, chills, congestion, rhinorrhea, sore throat, chest pain, denies shortness of breath, though does endorse sensation of chest tightness with expiration consistent with her prior asthma exacerbations.  Reports that she typically has a cough when she has asthma, and that her cough is nonproductive.  Denies GI symptoms.  Patient's recorded medical, surgical, social, medication list and allergies were reviewed in the Snapshot window as part of the initial history.   Prior to Admission medications  Medication Sig Start Date End Date Taking? Authorizing Provider  predniSONE  (DELTASONE ) 10 MG tablet Take 2 tablets (20 mg total) by mouth daily for 4 days. 10/16/24 10/20/24 Yes Rogelia Jerilynn RAMAN, MD  albuterol  (VENTOLIN  HFA) 108 [phone] Base) MCG/ACT inhaler Inhale 2 puffs into the lungs every 4 (four) hours as needed for wheezing or shortness of breath. 01/09/22   Curtiss Antonio CROME, MD  ketorolac  (TORADOL )  10 MG tablet Take 1 tablet (10 mg total) by mouth every 6 (six) hours as needed. 07/09/24   Jerrol Agent, MD  medroxyPROGESTERone  (DEPO-PROVERA ) 150 MG/ML injection Inject 1 mL (150 mg total) into the muscle every 3 (three) months. 07/04/24   Abigail Rollo DASEN, MD  metroNIDAZOLE  (METROGEL ) 0.75 % vaginal gel Place 1 Applicatorful vaginally at bedtime. Apply one applicatorful to vagina at bedtime for 5 days 07/04/24   Abigail Rollo DASEN, MD  oxyCODONE -acetaminophen  (PERCOCET/ROXICET) 5-325 MG tablet Take 1 tablet by mouth every 6 (six) hours as needed for severe pain (pain score 7-10). Patient not taking: Reported on 07/04/2024 11/26/23   Doretha Folks, MD     Allergies: Other   Review of Systems   ROS as per HPI  Physical Exam Updated Vital Signs BP 125/81   Pulse 66   Temp 98.4 F (36.9 C)   Resp 13   Wt 50.8 kg   LMP 10/12/2024   SpO2 97%   BMI 19.22 kg/m  Physical Exam Vitals and nursing note reviewed.  Constitutional:      General: She is not in acute distress.    Appearance: She is well-developed.  HENT:     Head: Normocephalic and atraumatic.  Eyes:     Conjunctiva/sclera: Conjunctivae normal.  Cardiovascular:     Rate and Rhythm: Normal rate and regular rhythm.     Heart sounds: No murmur heard.    Comments: Patient with heart rate in the 80s on my evaluation less than 10 minutes into patient's ED stay. Pulmonary:     Effort: Pulmonary effort is normal.  No tachypnea, accessory muscle usage or respiratory distress.     Breath sounds: Wheezing (Diffuse wheezing in both anterior and posterior lung fields) present.  Abdominal:     Palpations: Abdomen is soft.     Tenderness: There is no abdominal tenderness.  Musculoskeletal:        General: No swelling.     Cervical back: Neck supple.  Skin:    General: Skin is warm and dry.     Capillary Refill: Capillary refill takes less than 2 seconds.  Neurological:     Mental Status: She is alert.  Psychiatric:        Mood  and Affect: Mood normal.     ED Course/ Medical Decision Making/ A&P    Procedures Procedures   Medications Ordered in ED Medications  albuterol  (VENTOLIN  HFA) 108 (90 Base) MCG/ACT inhaler (has no administration in time range)  ipratropium-albuterol  (DUONEB) 0.5-2.5 (3) MG/3ML nebulizer solution 3 mL (3 mLs Nebulization Given 10/16/24 0808)    And  ipratropium-albuterol  (DUONEB) 0.5-2.5 (3) MG/3ML nebulizer solution 3 mL (3 mLs Nebulization Given 10/16/24 0808)    And  ipratropium-albuterol  (DUONEB) 0.5-2.5 (3) MG/3ML nebulizer solution 3 mL (3 mLs Nebulization Given 10/16/24 0807)  predniSONE  (DELTASONE ) tablet 60 mg (60 mg Oral Given 10/16/24 0827)  albuterol  (VENTOLIN  HFA) 108 (90 Base) MCG/ACT inhaler 1-2 puff (2 puffs Inhalation Given 10/16/24 0926)    Medical Decision Making:   [name] is a 18 y.o. female who presents for wheezing, cough as per above.  Physical exam is pertinent for diffuse wheezing in all lung fields without tachypnea or accessory muscle usage.   The differential includes but is not limited to asthma exacerbation, wheezing, pneumonia, viral infection, respiratory distress.  Independent historian: None  External data reviewed: No pertinent external data  Labs: Not indicated  Radiology: Not indicated No results found.  EKG/Medicine tests: Ordered and Independent interpretation EKG Interpretation: Sinus tachycardia Atrial premature complex Low voltage, precordial leads Abnormal Q suggests anterior infarct Confirmed by Rogelia Satterfield ([id number]) on 10/16/2024 8:11:25 AM  Interventions: DuoNeb X3, prednisone   See the EMR for full details regarding lab and imaging results.  Patient was initially mildly tachycardic on arrival to the ED, however by the time of my evaluation less than 10 minutes into her ED stay, patient had spontaneous resolution of tachycardia despite actively being on a DuoNeb with albuterol  which can increase heart rate, thus  low index of suspicion for underlying disease causing tachycardia, more likely was related to patient's exertion of walking into the ED while simultaneously being significantly wheezy.  Will monitor heart rate while in the ED.  Given patient reports that all of her symptoms including her nonproductive cough and wheezing are consistent with her usual asthma exacerbation, states that this is not as bad as her typical asthma exacerbation (is primarily here because she ran out of her inhaler), and she does not otherwise have sick symptoms, has no focal lung sounds changes on exam, only diffuse wheezing without rhonchi, rales, decreased breath sounds, do not feel that patient initially requires labs or imaging as presentation is most consistent with asthma exacerbation.  Will reevaluate patient after breathing treatments and prednisone , to determine if patient requires additional breathing treatments and admission versus transition to albuterol  puffs and potential discharge home.  On reevaluation, patient has normal heart rate, resolution of wheezing, feels back to baseline, therefore will provide albuterol  puffs, prescribe short course of prednisone  and discharge home, patient is comfortable with this plan.  Presentation is most consistent with exacerbation of chronic illness  Discussion of management or test interpretations with external provider(s): Not indicated  Risk Drugs:Prescription drug management  Disposition: DISCHARGE: I believe that the patient is safe for discharge home with outpatient follow-up. Patient was informed of all pertinent physical exam, laboratory, and imaging findings. Patient's suspected etiology of their symptom presentation was discussed with the patient and all questions were answered. We discussed following up with PCP. I provided thorough ED return precautions. The patient feels safe and comfortable with this plan.  MDM generated using voice dictation software and may  contain dictation errors.  Please contact me for any clarification or with any questions.  Clinical Impression:  1. Moderate persistent asthma with exacerbation      Discharge   Final Clinical Impression(s) / ED Diagnoses Final diagnoses:  Moderate persistent asthma with exacerbation    Rx / DC Orders ED Discharge Orders          Ordered    predniSONE  (DELTASONE ) 10 MG tablet  Daily        10/16/24 0929             Rogelia Jerilynn RAMAN, MD 10/16/24 [phone]  "

## 2024-10-16 NOTE — Discharge Instructions (Addendum)
 Marie Paul  Thank you for allowing us  to take care of you today.  You came to the Emergency Department today because you had cough and wheezing reminiscent of your typical asthma attacks and ran out of your inhaler at home.  Here in the emergency department you are negative truly significantly wheezy, therefore we gave you breathing treatments and you are now back to usual self.  We also gave you a steroid.  We are sending you home with a new albuterol  inhaler as well as a prescription for 4 days of steroids to help calm the inflammation.  Please use 4 puffs of your inhaler every 6 hours for the next 2 days, and thereafter you can use it as needed for shortness of breath/cough/wheezing.  To-Do: 1. Please follow-up with your primary doctor within 1 - 2 weeks / as soon as possible.   Please return to the Emergency Department or call 911 if you experience have worsening of your symptoms, or do not get better, chest pain, shortness of breath, severe or significantly worsening pain, high fever, severe confusion, pass out or have any reason to think that you need emergency medical care.   We hope you feel better soon.   Mitzie Later, MD Department of Emergency Medicine [facility]

## 2024-10-16 NOTE — ED Triage Notes (Signed)
 Pt c/o shob starting last night. Denies fever. Reports out of inhaler. Cough noted

## 2024-10-22 ENCOUNTER — Emergency Department (HOSPITAL_BASED_OUTPATIENT_CLINIC_OR_DEPARTMENT_OTHER)
Admission: EM | Admit: 2024-10-22 | Discharge: 2024-10-22 | Disposition: A | Discharge status: Home / Self Care | Payer: Self-pay | Attending: Emergency Medicine | Admitting: Emergency Medicine

## 2024-10-22 DIAGNOSIS — N131 Hydronephrosis with ureteral stricture, not elsewhere classified: Secondary | ICD-10-CM | POA: Insufficient documentation

## 2024-10-22 DIAGNOSIS — N135 Crossing vessel and stricture of ureter without hydronephrosis: Secondary | ICD-10-CM

## 2024-10-22 DIAGNOSIS — N23 Unspecified renal colic: Secondary | ICD-10-CM

## 2024-10-22 LAB — BASIC METABOLIC PANEL WITH GFR
Anion gap: 12 (ref 5–15)
BUN: 12 mg/dL (ref 6–20)
CO2: 23 mmol/L (ref 22–32)
Calcium: 9.4 mg/dL (ref 8.9–10.3)
Chloride: 103 mmol/L (ref 98–111)
Creatinine, Ser: 0.87 mg/dL (ref 0.44–1.00)
GFR, Estimated: >60 mL/min
Glucose, Bld: 86 mg/dL (ref 70–99)
Potassium: 3.5 mmol/L (ref 3.5–5.1)
Sodium: 138 mmol/L (ref 135–145)

## 2024-10-22 LAB — URINALYSIS, ROUTINE W REFLEX MICROSCOPIC
Bilirubin Urine: NEGATIVE
Glucose, UA: NEGATIVE mg/dL
Hgb urine dipstick: NEGATIVE
Ketones, ur: 40 mg/dL — AB
Leukocytes,Ua: NEGATIVE
Nitrite: NEGATIVE
Protein, ur: NEGATIVE mg/dL
Specific Gravity, Urine: >1.03 — ABNORMAL HIGH (ref 1.005–1.030)
pH: 6 (ref 5.0–8.0)

## 2024-10-22 LAB — CBC WITH DIFFERENTIAL/PLATELET
Abs Immature Granulocytes: 0.02 K/uL (ref 0.00–0.07)
Basophils Absolute: 0 K/uL (ref 0.0–0.1)
Basophils Relative: 0 %
Eosinophils Absolute: 0.1 K/uL (ref 0.0–0.5)
Eosinophils Relative: 1 %
HCT: 38 % (ref 36.0–46.0)
Hemoglobin: 12.7 g/dL (ref 12.0–15.0)
Immature Granulocytes: 0 %
Lymphocytes Relative: 37 %
Lymphs Abs: 3.6 K/uL (ref 0.7–4.0)
MCH: 26.9 pg (ref 26.0–34.0)
MCHC: 33.4 g/dL (ref 30.0–36.0)
MCV: 80.5 fL (ref 80.0–100.0)
Monocytes Absolute: 0.8 K/uL (ref 0.1–1.0)
Monocytes Relative: 8 %
Neutro Abs: 5.2 K/uL (ref 1.7–7.7)
Neutrophils Relative %: 54 %
Platelets: 208 K/uL (ref 150–400)
RBC: 4.72 MIL/uL (ref 3.87–5.11)
RDW: 13.5 % (ref 11.5–15.5)
WBC: 9.7 K/uL (ref 4.0–10.5)
nRBC: 0 % (ref 0.0–0.2)

## 2024-10-22 LAB — PREGNANCY, URINE: Preg Test, Ur: NEGATIVE

## 2024-10-22 MED ORDER — OXYCODONE-ACETAMINOPHEN 5-325 MG PO TABS: 1.0000 | ORAL_TABLET | Freq: Four times a day (QID) | ORAL | 0 refills | Status: AC | PRN

## 2024-10-22 MED ORDER — CEPHALEXIN 500 MG PO CAPS: 500.0000 mg | ORAL_CAPSULE | Freq: Three times a day (TID) | ORAL | 0 refills | Status: AC

## 2024-10-22 MED ORDER — KETOROLAC TROMETHAMINE 30 MG/ML IJ SOLN
15.0000 mg | Freq: Once | INTRAMUSCULAR | Status: AC
  Administered 2024-10-22: 15 mg via INTRAVENOUS
  Filled 2024-10-22: qty 1

## 2024-10-22 NOTE — ED Provider Notes (Signed)
 " Marie Paul Provider Note   CSN: [id number] Arrival date & time: 10/22/24  0140     Patient presents with: Flank Pain   Marie Paul is a 19 y.o. female.   Patient presents to the emergency department for evaluation of right flank pain.  Patient reports persistent severe pain with nausea.  Patient reports that she has had similar symptoms in the past.       Prior to Admission medications  Medication Sig Start Date End Date Taking? Authorizing Provider  cephALEXin  (KEFLEX ) 500 MG capsule Take 1 capsule (500 mg total) by mouth 3 (three) times daily. 10/22/24  Yes Juelle Dickmann, Lonni PARAS, MD  oxyCODONE -acetaminophen  (PERCOCET/ROXICET) 5-325 MG tablet Take 1 tablet by mouth every 6 (six) hours as needed for severe pain (pain score 7-10). 10/22/24  Yes Nickole Adamek, Lonni PARAS, MD  albuterol  (VENTOLIN  HFA) 108 (90 Base) MCG/ACT inhaler Inhale 2 puffs into the lungs every 4 (four) hours as needed for wheezing or shortness of breath. 01/09/22   Curtiss Antonio CROME, MD  medroxyPROGESTERone  (DEPO-PROVERA ) 150 MG/ML injection Inject 1 mL (150 mg total) into the muscle every 3 (three) months. 07/04/24   Abigail Rollo DASEN, MD    Allergies: Other    Review of Systems  Updated Vital Signs BP 128/65   Pulse 77   Temp (!) 97.5 F (36.4 C) (Oral)   Resp 20   LMP 10/12/2024   SpO2 99%   Physical Exam Vitals and nursing note reviewed.  Constitutional:      General: She is not in acute distress.    Appearance: She is well-developed.  HENT:     Head: Normocephalic and atraumatic.     Mouth/Throat:     Mouth: Mucous membranes are moist.  Eyes:     General: Vision grossly intact. Gaze aligned appropriately.     Extraocular Movements: Extraocular movements intact.     Conjunctiva/sclera: Conjunctivae normal.  Cardiovascular:     Rate and Rhythm: Normal rate and regular rhythm.     Pulses: Normal pulses.     Heart sounds: Normal heart sounds, S1  normal and S2 normal. No murmur heard.    No friction rub. No gallop.  Pulmonary:     Effort: Pulmonary effort is normal. No respiratory distress.     Breath sounds: Normal breath sounds.  Abdominal:     General: Bowel sounds are normal.     Palpations: Abdomen is soft.     Tenderness: There is no abdominal tenderness. There is right CVA tenderness. There is no guarding or rebound.     Hernia: No hernia is present.  Musculoskeletal:        General: No swelling.     Cervical back: Full passive range of motion without pain, normal range of motion and neck supple. No spinous process tenderness or muscular tenderness. Normal range of motion.     Right lower leg: No edema.     Left lower leg: No edema.  Skin:    General: Skin is warm and dry.     Capillary Refill: Capillary refill takes less than 2 seconds.     Findings: No ecchymosis, erythema, rash or wound.  Neurological:     General: No focal deficit present.     Mental Status: She is alert and oriented to person, place, and time.     GCS: GCS eye subscore is 4. GCS verbal subscore is 5. GCS motor subscore is 6.  Cranial Nerves: Cranial nerves 2-12 are intact.     Sensory: Sensation is intact.     Motor: Motor function is intact.     Coordination: Coordination is intact.  Psychiatric:        Attention and Perception: Attention normal.        Mood and Affect: Mood normal.        Speech: Speech normal.        Behavior: Behavior normal.     (all labs ordered are listed, but only abnormal results are displayed) Labs Reviewed  URINALYSIS, ROUTINE W REFLEX MICROSCOPIC - Abnormal; Notable for the following components:      Result Value   Specific Gravity, Urine >1.030 (*)    Ketones, ur 40 (*)    All other components within normal limits  PREGNANCY, URINE  CBC WITH DIFFERENTIAL/PLATELET  BASIC METABOLIC PANEL WITH GFR    EKG: None  Radiology: No results found.   Procedures   Medications Ordered in the ED  ketorolac   (TORADOL ) 30 MG/ML injection 15 mg (15 mg Intravenous Given 10/22/24 0302)                                    Medical Decision Making Amount and/or Complexity of Data Reviewed Labs: ordered.  Risk Prescription drug management.   Differential Diagnosis considered includes, but not limited to: Renal colic/kidney stone; pyelonephritis; aortic dissection; musculoskeletal pain.  Presents to the emergency department for evaluation of recurrent right flank pain.  I have reviewed her records, patient has been treated for possible passed kidney stones and pyelonephritis in the past.  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  This is likely symptomatic.  Urinalysis does not show any signs of infection.  There is no hematuria to suggest ureterolithiasis and she has not had stones seen in her kidneys on past scans.  Patient will be treated symptomatically and needs to follow-up with urology.  She was given a dose of Toradol  and has had pain resolution.  Will discharge with empiric antibiotics and analgesia.     Final diagnoses:  Ureteral stricture, right  Renal colic on right side    ED Discharge Orders          Ordered    oxyCODONE -acetaminophen  (PERCOCET/ROXICET) 5-325 MG tablet  Every 6 hours PRN        10/22/24 0338    cephALEXin  (KEFLEX ) 500 MG capsule  3 times daily        10/22/24 0338               Haze Lonni PARAS, MD 10/22/24 [phone]  "

## 2024-10-22 NOTE — ED Triage Notes (Signed)
 Pt states that she has been having R flank pain with nausea all day, denies hematuria
